# Patient Record
Sex: Female | Born: 1950 | Race: White | Hispanic: No | Marital: Married | Smoking: Never smoker
Health system: Southern US, Community
[De-identification: ages and names within clinical notes are randomized; demographics above are authoritative.]

## PROBLEM LIST (undated history)

## (undated) DIAGNOSIS — Z9889 Other specified postprocedural states: Secondary | ICD-10-CM

## (undated) DIAGNOSIS — K649 Unspecified hemorrhoids: Secondary | ICD-10-CM

## (undated) DIAGNOSIS — R112 Nausea with vomiting, unspecified: Secondary | ICD-10-CM

## (undated) DIAGNOSIS — K602 Anal fissure, unspecified: Secondary | ICD-10-CM

## (undated) DIAGNOSIS — T8859XA Other complications of anesthesia, initial encounter: Secondary | ICD-10-CM

## (undated) HISTORY — PX: RHINOPLASTY: SUR1284

## (undated) HISTORY — PX: OTHER SURGICAL HISTORY: SHX169

---

## 2000-03-18 ENCOUNTER — Encounter: Payer: Self-pay | Admitting: Gynecology

## 2000-03-18 ENCOUNTER — Encounter: Admission: RE | Admit: 2000-03-18 | Discharge: 2000-03-18 | Payer: Self-pay | Admitting: Gynecology

## 2000-05-26 ENCOUNTER — Ambulatory Visit (HOSPITAL_COMMUNITY): Admission: RE | Admit: 2000-05-26 | Discharge: 2000-05-26 | Payer: Self-pay | Admitting: Gastroenterology

## 2000-09-20 ENCOUNTER — Other Ambulatory Visit: Admission: RE | Admit: 2000-09-20 | Discharge: 2000-09-20 | Payer: Self-pay | Admitting: Gynecology

## 2001-03-23 ENCOUNTER — Encounter: Admission: RE | Admit: 2001-03-23 | Discharge: 2001-03-23 | Payer: Self-pay | Admitting: Gynecology

## 2001-03-23 ENCOUNTER — Encounter: Payer: Self-pay | Admitting: Gynecology

## 2001-10-17 ENCOUNTER — Other Ambulatory Visit: Admission: RE | Admit: 2001-10-17 | Discharge: 2001-10-17 | Payer: Self-pay | Admitting: Gynecology

## 2002-03-24 ENCOUNTER — Encounter: Admission: RE | Admit: 2002-03-24 | Discharge: 2002-03-24 | Payer: Self-pay | Admitting: Gynecology

## 2002-03-24 ENCOUNTER — Encounter: Payer: Self-pay | Admitting: Gynecology

## 2002-10-30 ENCOUNTER — Other Ambulatory Visit: Admission: RE | Admit: 2002-10-30 | Discharge: 2002-10-30 | Payer: Self-pay | Admitting: Gynecology

## 2003-04-06 ENCOUNTER — Encounter: Admission: RE | Admit: 2003-04-06 | Discharge: 2003-04-06 | Payer: Self-pay | Admitting: Gynecology

## 2003-06-13 ENCOUNTER — Ambulatory Visit (HOSPITAL_BASED_OUTPATIENT_CLINIC_OR_DEPARTMENT_OTHER): Admission: RE | Admit: 2003-06-13 | Discharge: 2003-06-13 | Payer: Self-pay | Admitting: Gynecology

## 2003-06-13 ENCOUNTER — Encounter (INDEPENDENT_AMBULATORY_CARE_PROVIDER_SITE_OTHER): Payer: Self-pay | Admitting: Specialist

## 2003-06-13 ENCOUNTER — Ambulatory Visit (HOSPITAL_COMMUNITY): Admission: RE | Admit: 2003-06-13 | Discharge: 2003-06-13 | Payer: Self-pay | Admitting: Gynecology

## 2003-10-31 ENCOUNTER — Other Ambulatory Visit: Admission: RE | Admit: 2003-10-31 | Discharge: 2003-10-31 | Payer: Self-pay | Admitting: Gynecology

## 2004-05-01 ENCOUNTER — Encounter: Admission: RE | Admit: 2004-05-01 | Discharge: 2004-05-01 | Payer: Self-pay | Admitting: Gynecology

## 2004-11-04 ENCOUNTER — Other Ambulatory Visit: Admission: RE | Admit: 2004-11-04 | Discharge: 2004-11-04 | Payer: Self-pay | Admitting: Gynecology

## 2005-05-28 ENCOUNTER — Encounter: Admission: RE | Admit: 2005-05-28 | Discharge: 2005-05-28 | Payer: Self-pay | Admitting: Gynecology

## 2005-06-08 ENCOUNTER — Encounter: Admission: RE | Admit: 2005-06-08 | Discharge: 2005-06-08 | Payer: Self-pay | Admitting: Gynecology

## 2005-11-16 ENCOUNTER — Other Ambulatory Visit: Admission: RE | Admit: 2005-11-16 | Discharge: 2005-11-16 | Payer: Self-pay | Admitting: Gynecology

## 2005-11-18 ENCOUNTER — Encounter: Admission: RE | Admit: 2005-11-18 | Discharge: 2005-11-18 | Payer: Self-pay | Admitting: Gynecology

## 2006-05-31 ENCOUNTER — Encounter: Admission: RE | Admit: 2006-05-31 | Discharge: 2006-05-31 | Payer: Self-pay | Admitting: Gynecology

## 2006-12-13 ENCOUNTER — Other Ambulatory Visit: Admission: RE | Admit: 2006-12-13 | Discharge: 2006-12-13 | Payer: Self-pay | Admitting: Gynecology

## 2007-06-03 ENCOUNTER — Encounter: Admission: RE | Admit: 2007-06-03 | Discharge: 2007-06-03 | Payer: Self-pay | Admitting: Gynecology

## 2007-09-27 ENCOUNTER — Emergency Department (HOSPITAL_COMMUNITY): Admission: EM | Admit: 2007-09-27 | Discharge: 2007-09-27 | Payer: Self-pay | Admitting: Emergency Medicine

## 2008-06-05 ENCOUNTER — Encounter: Admission: RE | Admit: 2008-06-05 | Discharge: 2008-06-05 | Payer: Self-pay | Admitting: Gynecology
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
## 2009-06-06 ENCOUNTER — Encounter: Admission: RE | Admit: 2009-06-06 | Discharge: 2009-06-06 | Payer: Self-pay | Admitting: Gynecology

## 2010-05-31 ENCOUNTER — Encounter: Payer: Self-pay | Admitting: Gynecology

## 2010-06-01 ENCOUNTER — Encounter: Payer: Self-pay | Admitting: Gynecology

## 2010-06-09 ENCOUNTER — Encounter
Admission: RE | Admit: 2010-06-09 | Discharge: 2010-06-09 | Payer: Self-pay | Source: Home / Self Care | Attending: Gynecology | Admitting: Gynecology

## 2010-09-26 NOTE — Op Note (Signed)
NAME:  Leah Edwards, Leah Edwards                               ACCOUNT NO.:  [id number]   MEDICAL RECORD NO.:  [id number]                   PATIENT TYPE:  AMB   LOCATION:  [facility]                                 FACILITY:  [facility]   PHYSICIAN:  Gretta Cool, M.D.              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   PROCEDURE:  Hysteroscopy resection of submucous fibroid and total  endometrial resection for ablation plus vapotrode.   SURGEON:  Gretta Cool, M.D.   ANESTHESIA:  MAC and paracervical block.   DESCRIPTION OF PROCEDURE:  Under excellent IV sedation as above with  paracervical block the cervix was progressively dilated with a series of  Pratt dilators to accommodate 7 mm resectoscope.  Careful examination of the  cavity confirmed a large but subtle posterior uterine wall leiomyoma in the  mid-fundal wall.  It occupied at least two-thirds of the posterior wall of  the uterus.  The fibroid was unroofed of endometrium and then progressively  resected by resection and by pulling the fibroid with the resectoscope loop  up out of the bed of the capsule.  The fibroid was thus progressively  resected estimated size at least 2 cm AP diameter and more transverse  diameter.  At this point once the fibroid was totally resected out of the  capsule,  bleeding was well controlled, the entire endometrial cavity was  then resected down to myometrium into the myometrium at least 3 mm.  At this  point the entire cavity was treated by vapotrode so as to eliminate any  islands of residual endometrial tissue.  The cornual areas were treated by  touch technique.  At this point the procedure was terminated without  complication, there was no  significant bleeding at reduced pressure and a  deficit estimated at 150 mL.  At the end of the procedure sponge and lap  counts were correct, there were no complications, and patient returned to  recovery room in excellent condition.                                               Gretta Cool, M.D.    CWL/MEDQ  D:  06/13/2003  T:  06/13/2003  Job:  [id number]   cc:   Molly Maduro A. Nicholos Johns, M.D.  [address]  [place]  [place] [address]  Fax: [phone]

## 2010-09-26 NOTE — Procedures (Signed)
[facility]  Patient:    Leah Edwards, Leah Edwards                      MRN: [id number] Proc. Date: 05/26/00 Adm. Date:  [id number] Attending:  Orland Mustard CC:         Elana Alm. Eliezer Lofts., M.D.  Gretta Cool, M.D.   Procedure Report  DATE OF BIRTH:  1951/03/13  PROCEDURE:  Colonoscopy.  MEDICATIONS:  Fentanyl 62.5 mcg, Versed 6 mg IV.  SCOPE:  Olympus pediatric video colonoscope.  INDICATIONS:  Hemoccult positive stool in a 60 year old.  DESCRIPTION OF PROCEDURE:  The procedure had been explained to the patient and consent obtained.  With the patient in the left lateral decubitus, the adult Olympus pediatric video colonoscope was inserted and advanced under direct visualization.  The prep was excellent.  We were able to advance to the cecum using abdominal pressure and position changes.  The right lower quadrant was transilluminated, and the ileocecal valve and the appendiceal orifice were seen.  The scope was withdrawn.  The cecum and ascending colon, hepatic flexure, transverse colon, splenic flexure, descending and sigmoid colon seen well upon removal.  No polyps or other lesions seen.  Internal hemorrhoids seen in the rectum upon removal of the scope.  The scope was withdrawn, and the patient tolerated the procedure well and was maintained on low-flow oxygen and pulse oximeter throughout the procedure with no obvious problem.  ASSESSMENT: 1. Hemoccult positive stool. 2. Internal hemorrhoids, possibly the source of her stool.  PLAN: 1. We will follow up in the office in 2-3 months and recheck her stool. 2. Will give hemorrhoid instruction sheet. DD:  05/26/00 TD:  05/26/00 Job: [id number]

## 2011-02-02 ENCOUNTER — Other Ambulatory Visit: Payer: Self-pay | Admitting: Gynecology

## 2011-07-09 ENCOUNTER — Other Ambulatory Visit: Payer: Self-pay | Admitting: Gynecology

## 2011-07-09 DIAGNOSIS — Z1231 Encounter for screening mammogram for malignant neoplasm of breast: Secondary | ICD-10-CM

## 2011-07-16 ENCOUNTER — Ambulatory Visit: Payer: Self-pay

## 2011-07-17 ENCOUNTER — Ambulatory Visit
Admission: RE | Admit: 2011-07-17 | Discharge: 2011-07-17 | Disposition: A | Discharge status: Home / Self Care | Payer: 59 | Source: Ambulatory Visit | Attending: Gynecology | Admitting: Gynecology

## 2011-07-17 DIAGNOSIS — Z1231 Encounter for screening mammogram for malignant neoplasm of breast: Secondary | ICD-10-CM

## 2012-02-11 ENCOUNTER — Other Ambulatory Visit: Payer: Self-pay | Admitting: Gynecology

## 2012-07-12 ENCOUNTER — Other Ambulatory Visit: Payer: Self-pay

## 2012-07-12 DIAGNOSIS — Z1231 Encounter for screening mammogram for malignant neoplasm of breast: Secondary | ICD-10-CM

## 2012-08-23 ENCOUNTER — Ambulatory Visit: Payer: 59

## 2012-08-24 ENCOUNTER — Telehealth (INDEPENDENT_AMBULATORY_CARE_PROVIDER_SITE_OTHER): Payer: Self-pay

## 2012-08-24 NOTE — Telephone Encounter (Signed)
Called the pt to schedule appt to see Dr. Donell Beers for her bleeding hemorrhoids.  She is out of town, but will call back next week to schedule.  Dr. Dahlia Client office has faxed a copy of her colonoscopy for Dr. Donell Beers to review.

## 2012-10-07 ENCOUNTER — Encounter (INDEPENDENT_AMBULATORY_CARE_PROVIDER_SITE_OTHER): Payer: Self-pay

## 2012-10-21 ENCOUNTER — Ambulatory Visit
Admission: RE | Admit: 2012-10-21 | Discharge: 2012-10-21 | Disposition: A | Discharge status: Home / Self Care | Payer: 59 | Source: Ambulatory Visit

## 2012-10-21 DIAGNOSIS — Z1231 Encounter for screening mammogram for malignant neoplasm of breast: Secondary | ICD-10-CM

## 2012-12-02 ENCOUNTER — Ambulatory Visit (INDEPENDENT_AMBULATORY_CARE_PROVIDER_SITE_OTHER): Payer: 59 | Admitting: General Surgery

## 2012-12-02 ENCOUNTER — Encounter (INDEPENDENT_AMBULATORY_CARE_PROVIDER_SITE_OTHER): Payer: Self-pay | Admitting: General Surgery

## 2012-12-02 VITALS — BP 114/62 | HR 60 | Temp 97.7°F | Resp 16 | Ht 66.0 in | Wt 136.6 lb

## 2012-12-02 DIAGNOSIS — K602 Anal fissure, unspecified: Secondary | ICD-10-CM | POA: Insufficient documentation

## 2012-12-02 DIAGNOSIS — K648 Other hemorrhoids: Secondary | ICD-10-CM

## 2012-12-02 DIAGNOSIS — K644 Residual hemorrhoidal skin tags: Secondary | ICD-10-CM | POA: Insufficient documentation

## 2012-12-02 MED ORDER — DOCUSATE SODIUM 100 MG PO CAPS: 100.0000 mg | ORAL_CAPSULE | Freq: Two times a day (BID) | ORAL | Status: AC

## 2012-12-02 MED ORDER — PRAMOXINE HCL 1 % RE FOAM: RECTAL | Status: DC | PRN

## 2012-12-02 MED ORDER — POLYETHYLENE GLYCOL 3350 17 GM/SCOOP PO POWD: 17.0000 g | Freq: Two times a day (BID) | ORAL | Status: DC

## 2012-12-02 NOTE — Progress Notes (Signed)
Subjective:     Patient ID: Leah Edwards, female   DOB: 1950/12/08, 62 y.o.   MRN: [id number]  HPI Patient is a 62 year old female that I saw several years ago for hemorrhoids.  She had been doing significantly better until the last few months. She had a repeat colonoscopy in the spring. After that she had significant pain.  She got very constipated and had difficulty relieving. She took multiple over-the-counter laxatives and had no results. She tried Citrucel which caused her to have significant cramping. She has bleeding every time she has a bowel movement. The severe pain has resolved. She never felt a hard knot on her anus. She continues to have small hemorrhoids that she can feel externally. She has also tried sitz baths but felt that they did not help significantly.  Previously, she had good results with Proctofoam and with banding.  Review of Systems  Gastrointestinal: Positive for constipation, anal bleeding and rectal pain.       Objective:   Physical Exam  Constitutional: She is oriented to person, place, and time. She appears well-developed and well-nourished. No distress.  HENT:  Head: Normocephalic and atraumatic.  Eyes: Pupils are equal, round, and reactive to light. No scleral icterus.  Cardiovascular: Normal rate.   Pulmonary/Chest: Effort normal. No respiratory distress.  Abdominal: Soft.  Genitourinary: Rectal exam shows external hemorrhoid, internal hemorrhoid and fissure.  Anterior external hemorrhoids, small, non thrombosed. Healing fissure left lateral  Flat hemorrhoids injected, anterior right, lateral left, and posterior midline.    Neurological: She is alert and oriented to person, place, and time. Coordination normal.  Skin: Skin is warm and dry. No rash noted. She is not diaphoretic. No erythema. No pallor.  Psychiatric: She has a normal mood and affect. Her behavior is normal. Judgment and thought content normal.       Assessment/Plan:     Anal  fissure Appears nearly completely healed.    Would hold on diltiazem cream for now.    Internal and external bleeding hemorrhoids Aggressively treat constipation.  Add miralax and colace.  Reviewed importance of being aggressive. Sitz baths.  followup in 8 weeks or as needed

## 2012-12-02 NOTE — Assessment & Plan Note (Signed)
Appears nearly completely healed.    Would hold on diltiazem cream for now.

## 2012-12-02 NOTE — Assessment & Plan Note (Signed)
Aggressively treat constipation.  Add miralax and colace.  Reviewed importance of being aggressive. Sitz baths.

## 2012-12-02 NOTE — Patient Instructions (Signed)
Take miralax 17 gm twice a day.  Add stool softener (colace 100 mg) twice a day.  Continue fiber intake, avoidance of long periods of time on the toilet.    Continue high water intake.

## 2014-01-23 ENCOUNTER — Other Ambulatory Visit: Payer: Self-pay

## 2014-01-23 DIAGNOSIS — Z1231 Encounter for screening mammogram for malignant neoplasm of breast: Secondary | ICD-10-CM

## 2014-02-01 ENCOUNTER — Ambulatory Visit
Admission: RE | Admit: 2014-02-01 | Discharge: 2014-02-01 | Disposition: A | Discharge status: Home / Self Care | Payer: 59 | Source: Ambulatory Visit

## 2014-02-01 DIAGNOSIS — Z1231 Encounter for screening mammogram for malignant neoplasm of breast: Secondary | ICD-10-CM

## 2015-06-28 ENCOUNTER — Other Ambulatory Visit: Payer: Self-pay | Admitting: Physician Assistant

## 2015-06-28 ENCOUNTER — Ambulatory Visit
Admission: RE | Admit: 2015-06-28 | Discharge: 2015-06-28 | Disposition: A | Discharge status: Home / Self Care | Payer: PRIVATE HEALTH INSURANCE | Source: Ambulatory Visit | Attending: Physician Assistant | Admitting: Physician Assistant

## 2015-06-28 DIAGNOSIS — M79671 Pain in right foot: Secondary | ICD-10-CM

## 2017-05-11 HISTORY — PX: OTHER SURGICAL HISTORY: SHX169

## 2018-03-08 ENCOUNTER — Other Ambulatory Visit: Payer: Self-pay | Admitting: Plastic Surgery

## 2018-05-23 DIAGNOSIS — L219 Seborrheic dermatitis, unspecified: Secondary | ICD-10-CM | POA: Diagnosis not present

## 2018-09-21 DIAGNOSIS — Z79890 Hormone replacement therapy (postmenopausal): Secondary | ICD-10-CM | POA: Diagnosis not present

## 2018-09-21 DIAGNOSIS — Z780 Asymptomatic menopausal state: Secondary | ICD-10-CM | POA: Diagnosis not present

## 2018-09-21 DIAGNOSIS — Z01419 Encounter for gynecological examination (general) (routine) without abnormal findings: Secondary | ICD-10-CM | POA: Diagnosis not present

## 2018-10-31 DIAGNOSIS — R69 Illness, unspecified: Secondary | ICD-10-CM | POA: Diagnosis not present

## 2018-11-04 DIAGNOSIS — Z780 Asymptomatic menopausal state: Secondary | ICD-10-CM | POA: Diagnosis not present

## 2018-11-04 DIAGNOSIS — Z79890 Hormone replacement therapy (postmenopausal): Secondary | ICD-10-CM | POA: Diagnosis not present

## 2018-11-04 DIAGNOSIS — Z01419 Encounter for gynecological examination (general) (routine) without abnormal findings: Secondary | ICD-10-CM | POA: Diagnosis not present

## 2018-11-04 DIAGNOSIS — Z0000 Encounter for general adult medical examination without abnormal findings: Secondary | ICD-10-CM | POA: Diagnosis not present

## 2018-11-14 DIAGNOSIS — R946 Abnormal results of thyroid function studies: Secondary | ICD-10-CM | POA: Diagnosis not present

## 2018-11-14 DIAGNOSIS — M7989 Other specified soft tissue disorders: Secondary | ICD-10-CM | POA: Diagnosis not present

## 2018-11-21 DIAGNOSIS — R946 Abnormal results of thyroid function studies: Secondary | ICD-10-CM | POA: Diagnosis not present

## 2019-01-02 DIAGNOSIS — R946 Abnormal results of thyroid function studies: Secondary | ICD-10-CM | POA: Diagnosis not present

## 2019-02-10 DIAGNOSIS — R69 Illness, unspecified: Secondary | ICD-10-CM | POA: Diagnosis not present

## 2019-02-13 DIAGNOSIS — Z20828 Contact with and (suspected) exposure to other viral communicable diseases: Secondary | ICD-10-CM | POA: Diagnosis not present

## 2019-03-21 DIAGNOSIS — Z1231 Encounter for screening mammogram for malignant neoplasm of breast: Secondary | ICD-10-CM | POA: Diagnosis not present

## 2019-03-28 DIAGNOSIS — R519 Headache, unspecified: Secondary | ICD-10-CM | POA: Diagnosis not present

## 2019-06-01 ENCOUNTER — Ambulatory Visit: Payer: Medicare HMO

## 2019-06-10 ENCOUNTER — Ambulatory Visit: Payer: Medicare HMO

## 2019-06-17 ENCOUNTER — Ambulatory Visit: Payer: Medicare HMO

## 2019-07-01 ENCOUNTER — Ambulatory Visit: Payer: Medicare HMO

## 2019-07-06 ENCOUNTER — Ambulatory Visit: Payer: PRIVATE HEALTH INSURANCE

## 2019-07-06 ENCOUNTER — Ambulatory Visit: Payer: Medicare HMO | Attending: Internal Medicine

## 2019-07-06 DIAGNOSIS — Z23 Encounter for immunization: Secondary | ICD-10-CM

## 2019-07-06 NOTE — Progress Notes (Signed)
   Covid-19 Vaccination Clinic  Name:  Leah Edwards    MRN: [id number] DOB: [birth year]  07/06/2019  [name] was observed post Covid-19 immunization for 15 minutes without incidence. She was provided with Vaccine Information Sheet and instruction to access the V-Safe system.   [name] was instructed to call 911 with any severe reactions post vaccine: Marland Kitchen Difficulty breathing  . Swelling of your face and throat  . A fast heartbeat  . A bad rash all over your body  . Dizziness and weakness    Immunizations Administered    Name Date Dose VIS Date Route   Pfizer COVID-19 Vaccine 07/06/2019  1:52 PM 0.3 mL 04/21/2019 Intramuscular   Manufacturer: [place]   Lot: [id number]   [place]: [id number]

## 2019-07-27 ENCOUNTER — Ambulatory Visit: Payer: Medicare HMO | Attending: Internal Medicine

## 2019-07-27 DIAGNOSIS — Z23 Encounter for immunization: Secondary | ICD-10-CM

## 2019-07-27 NOTE — Progress Notes (Signed)
   Covid-19 Vaccination Clinic  Name:  Leah Edwards    MRN: [id number] DOB: 05/28/1950  07/27/2019  Leah Edwards was observed post Covid-19 immunization for 15 minutes without incident. She was provided with Vaccine Information Sheet and instruction to access the V-Safe system.   Leah Edwards was instructed to call 911 with any severe reactions post vaccine: Marland Kitchen Difficulty breathing  . Swelling of face and throat  . A fast heartbeat  . A bad rash all over body  . Dizziness and weakness   Immunizations Administered    Name Date Dose VIS Date Route   Pfizer COVID-19 Vaccine 07/27/2019 11:06 AM 0.3 mL 04/21/2019 Intramuscular   Manufacturer: [place]   Lot: [id number]   [place]: [id number]

## 2019-10-19 DIAGNOSIS — Z01419 Encounter for gynecological examination (general) (routine) without abnormal findings: Secondary | ICD-10-CM | POA: Diagnosis not present

## 2019-10-19 DIAGNOSIS — Z79890 Hormone replacement therapy (postmenopausal): Secondary | ICD-10-CM | POA: Diagnosis not present

## 2019-10-19 DIAGNOSIS — Z780 Asymptomatic menopausal state: Secondary | ICD-10-CM | POA: Diagnosis not present

## 2020-01-03 DIAGNOSIS — K602 Anal fissure, unspecified: Secondary | ICD-10-CM | POA: Diagnosis not present

## 2020-01-23 DIAGNOSIS — L821 Other seborrheic keratosis: Secondary | ICD-10-CM | POA: Diagnosis not present

## 2020-01-23 DIAGNOSIS — Z1283 Encounter for screening for malignant neoplasm of skin: Secondary | ICD-10-CM | POA: Diagnosis not present

## 2020-01-23 DIAGNOSIS — D225 Melanocytic nevi of trunk: Secondary | ICD-10-CM | POA: Diagnosis not present

## 2020-02-24 DIAGNOSIS — R69 Illness, unspecified: Secondary | ICD-10-CM | POA: Diagnosis not present

## 2020-03-05 DIAGNOSIS — K602 Anal fissure, unspecified: Secondary | ICD-10-CM | POA: Diagnosis not present

## 2020-04-18 DIAGNOSIS — Z0100 Encounter for examination of eyes and vision without abnormal findings: Secondary | ICD-10-CM | POA: Diagnosis not present

## 2020-04-18 DIAGNOSIS — H5213 Myopia, bilateral: Secondary | ICD-10-CM | POA: Diagnosis not present

## 2020-06-05 DIAGNOSIS — Z20822 Contact with and (suspected) exposure to covid-19: Secondary | ICD-10-CM | POA: Diagnosis not present

## 2020-06-05 DIAGNOSIS — Z03818 Encounter for observation for suspected exposure to other biological agents ruled out: Secondary | ICD-10-CM | POA: Diagnosis not present

## 2020-07-22 DIAGNOSIS — Z809 Family history of malignant neoplasm, unspecified: Secondary | ICD-10-CM | POA: Diagnosis not present

## 2020-07-22 DIAGNOSIS — Z79890 Hormone replacement therapy (postmenopausal): Secondary | ICD-10-CM | POA: Diagnosis not present

## 2020-10-22 DIAGNOSIS — Z01419 Encounter for gynecological examination (general) (routine) without abnormal findings: Secondary | ICD-10-CM | POA: Diagnosis not present

## 2020-10-22 DIAGNOSIS — Z780 Asymptomatic menopausal state: Secondary | ICD-10-CM | POA: Diagnosis not present

## 2020-10-22 DIAGNOSIS — Z79890 Hormone replacement therapy (postmenopausal): Secondary | ICD-10-CM | POA: Diagnosis not present

## 2020-10-22 DIAGNOSIS — Z6822 Body mass index (BMI) 22.0-22.9, adult: Secondary | ICD-10-CM | POA: Diagnosis not present

## 2020-10-22 DIAGNOSIS — Z1231 Encounter for screening mammogram for malignant neoplasm of breast: Secondary | ICD-10-CM | POA: Diagnosis not present

## 2021-01-22 DIAGNOSIS — L218 Other seborrheic dermatitis: Secondary | ICD-10-CM | POA: Diagnosis not present

## 2021-01-22 DIAGNOSIS — L821 Other seborrheic keratosis: Secondary | ICD-10-CM | POA: Diagnosis not present

## 2021-01-22 DIAGNOSIS — Z1283 Encounter for screening for malignant neoplasm of skin: Secondary | ICD-10-CM | POA: Diagnosis not present

## 2021-02-14 DIAGNOSIS — J329 Chronic sinusitis, unspecified: Secondary | ICD-10-CM | POA: Diagnosis not present

## 2021-02-15 DIAGNOSIS — J329 Chronic sinusitis, unspecified: Secondary | ICD-10-CM | POA: Diagnosis not present

## 2021-02-15 DIAGNOSIS — R21 Rash and other nonspecific skin eruption: Secondary | ICD-10-CM | POA: Diagnosis not present

## 2021-02-15 DIAGNOSIS — R051 Acute cough: Secondary | ICD-10-CM | POA: Diagnosis not present

## 2021-03-10 DIAGNOSIS — Z131 Encounter for screening for diabetes mellitus: Secondary | ICD-10-CM | POA: Diagnosis not present

## 2021-03-10 DIAGNOSIS — Z136 Encounter for screening for cardiovascular disorders: Secondary | ICD-10-CM | POA: Diagnosis not present

## 2021-03-10 DIAGNOSIS — E559 Vitamin D deficiency, unspecified: Secondary | ICD-10-CM | POA: Diagnosis not present

## 2021-03-10 DIAGNOSIS — Z0000 Encounter for general adult medical examination without abnormal findings: Secondary | ICD-10-CM | POA: Diagnosis not present

## 2021-03-10 DIAGNOSIS — Z1159 Encounter for screening for other viral diseases: Secondary | ICD-10-CM | POA: Diagnosis not present

## 2021-03-10 DIAGNOSIS — Z13220 Encounter for screening for lipoid disorders: Secondary | ICD-10-CM | POA: Diagnosis not present

## 2021-03-10 DIAGNOSIS — E2839 Other primary ovarian failure: Secondary | ICD-10-CM | POA: Diagnosis not present

## 2021-03-10 DIAGNOSIS — Z23 Encounter for immunization: Secondary | ICD-10-CM | POA: Diagnosis not present

## 2021-03-26 DIAGNOSIS — Z780 Asymptomatic menopausal state: Secondary | ICD-10-CM | POA: Diagnosis not present

## 2021-05-15 DIAGNOSIS — H5212 Myopia, left eye: Secondary | ICD-10-CM | POA: Diagnosis not present

## 2021-10-09 DIAGNOSIS — U071 COVID-19: Secondary | ICD-10-CM | POA: Diagnosis not present

## 2021-11-12 DIAGNOSIS — Z79890 Hormone replacement therapy (postmenopausal): Secondary | ICD-10-CM | POA: Diagnosis not present

## 2021-11-12 DIAGNOSIS — Z1231 Encounter for screening mammogram for malignant neoplasm of breast: Secondary | ICD-10-CM | POA: Diagnosis not present

## 2021-11-12 DIAGNOSIS — Z01419 Encounter for gynecological examination (general) (routine) without abnormal findings: Secondary | ICD-10-CM | POA: Diagnosis not present

## 2022-03-27 DIAGNOSIS — Z0000 Encounter for general adult medical examination without abnormal findings: Secondary | ICD-10-CM | POA: Diagnosis not present

## 2022-03-27 DIAGNOSIS — Z86010 Personal history of colonic polyps: Secondary | ICD-10-CM | POA: Diagnosis not present

## 2022-03-27 DIAGNOSIS — K602 Anal fissure, unspecified: Secondary | ICD-10-CM | POA: Diagnosis not present

## 2022-03-27 DIAGNOSIS — E559 Vitamin D deficiency, unspecified: Secondary | ICD-10-CM | POA: Diagnosis not present

## 2022-03-27 DIAGNOSIS — N393 Stress incontinence (female) (male): Secondary | ICD-10-CM | POA: Diagnosis not present

## 2022-03-27 DIAGNOSIS — Z23 Encounter for immunization: Secondary | ICD-10-CM | POA: Diagnosis not present

## 2022-03-27 DIAGNOSIS — K5909 Other constipation: Secondary | ICD-10-CM | POA: Diagnosis not present

## 2022-03-27 DIAGNOSIS — K648 Other hemorrhoids: Secondary | ICD-10-CM | POA: Diagnosis not present

## 2022-03-27 DIAGNOSIS — J309 Allergic rhinitis, unspecified: Secondary | ICD-10-CM | POA: Diagnosis not present

## 2022-04-11 DIAGNOSIS — Z823 Family history of stroke: Secondary | ICD-10-CM | POA: Diagnosis not present

## 2022-04-11 DIAGNOSIS — Z809 Family history of malignant neoplasm, unspecified: Secondary | ICD-10-CM | POA: Diagnosis not present

## 2022-04-11 DIAGNOSIS — Z79890 Hormone replacement therapy (postmenopausal): Secondary | ICD-10-CM | POA: Diagnosis not present

## 2022-04-28 DIAGNOSIS — K602 Anal fissure, unspecified: Secondary | ICD-10-CM | POA: Diagnosis not present

## 2022-05-12 DIAGNOSIS — R69 Illness, unspecified: Secondary | ICD-10-CM | POA: Diagnosis not present

## 2022-07-11 DIAGNOSIS — R69 Illness, unspecified: Secondary | ICD-10-CM | POA: Diagnosis not present

## 2022-07-20 ENCOUNTER — Telehealth: Payer: Self-pay | Admitting: Gastroenterology

## 2022-07-20 NOTE — Telephone Encounter (Signed)
Good Afternoon Dr Silverio Decamp,  We have received a referral for this patient from Dr Dema Severin for a colonoscopy, anal fissure, and hemorrhoids. Patient is a former patient of Dr Oletta Lamas with [facility], unhappy with her care there and hearing good things about you she is requesting to transfer her care over to you. Records will be sent to your office, please review them at your earliest convenience and advise on scheduling.   Thank You!

## 2022-07-21 ENCOUNTER — Ambulatory Visit: Payer: Self-pay | Admitting: General Surgery

## 2022-07-21 DIAGNOSIS — K602 Anal fissure, unspecified: Secondary | ICD-10-CM | POA: Diagnosis not present

## 2022-07-21 NOTE — H&P (Signed)
PROVIDER:  Monico Blitz, MD  MRN: [id number] DOB: 04/22/1951 DATE OF ENCOUNTER: 07/21/2022 Subjective   Chief Complaint: No chief complaint on file.     History of Present Illness: Leah Edwards is a 72 y.o. 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  I last saw her in the office in Oct 2021.  She had presented at that time with pain.  On examination she was noted to have an anal fissure.  She was placed on diltiazem ointment and had significant improvement in her symptoms.  She continues to have intermittent symptoms.  She reports irregular bowel habits as well.  She has intermittent bleeding.  She is due for colonoscopy in May 2024.  She has been referred to Dr. Silverio Decamp for this.  We decided to try a regular fiber supplement to see if this would help with her irregular bowel movements.  I also placed her on diltiazem ointment for an anal fissure.  She continues to have issues with pain with bowel movements.  No past medical history on file. Past Surgical History:  Procedure Laterality Date   ovarian cyst     RHINOPLASTY      Social History   Socioeconomic History   Marital status: Married    Spouse name: Not on file   Number of children: Not on file   Years of education: Not on file   Highest education level: Not on file  Occupational History   Not on file  Tobacco Use   Smoking status: Never   Smokeless tobacco: Never  Substance and Sexual Activity   Alcohol use: Yes    Comment: 1 drink per week   Drug use: No   Sexual activity: Not on file  Other Topics Concern   Not on file  Social History Narrative   Not on file   Social Determinants of Health   Financial Resource Strain: Not on file  Food Insecurity: Not on file  Transportation Needs: Not on file  Physical Activity: Not on file  Stress: Not on file  Social Connections: Not on file  Intimate Partner Violence: Not on file   Family History  Problem Relation Age of Onset   Cancer Father         prostate    Current Outpatient Medications:    docusate sodium (COLACE) 100 MG capsule, Take 1 capsule (100 mg total) by mouth 2 (two) times daily., Disp: 10 capsule, Rfl: 0   MINIVELLE 0.0375 MG/24HR, , Disp: , Rfl:    polyethylene glycol powder (MIRALAX) powder, Take 17 g by mouth 2 (two) times daily., Disp: 255 g, Rfl: 0   pramoxine (PROCTOFOAM) 1 % foam, Place rectally every 4 (four) hours as needed for hemorrhoids., Disp: 15 g, Rfl: 2   progesterone (PROMETRIUM) 200 MG capsule, , Disp: , Rfl:    ranitidine (ZANTAC) 75 MG tablet, Take 75 mg by mouth 2 (two) times daily., Disp: , Rfl:   No Known Allergies   Review of Systems - Negative except as stated above    Objective:    General appearance - alert, well appearing, and in no distress Rectal - normal rectal, no masses, right posterior fissure, no sphincter hypertension     Assessment and Plan:  There are no diagnoses linked to this encounter.   72 year old female who presented to the office for acute anal pain.  On exam today she has a right posterior anal fissure.  She did not have a good response to the  diltiazem ointment.  On examination today she appears to have more of a traction injury due to her right posterior hemorrhoid.  There is chronic inflammatory tissue nearby that seems to be prohibiting wound healing.  I have recommended hemorrhoidectomy of her right posterior hemorrhoid column.  If she has any other internal disease, she would like that addressed at the same time.  We discussed the risk of surgery including pain, bleeding and recurrence.  All questions were answered.  Patient would like to proceed.      Rosario Adie, MD Colon and Rectal Surgery [facility]

## 2022-07-29 DIAGNOSIS — H35363 Drusen (degenerative) of macula, bilateral: Secondary | ICD-10-CM | POA: Diagnosis not present

## 2022-08-20 ENCOUNTER — Encounter: Payer: Self-pay | Admitting: Podiatry

## 2022-08-20 ENCOUNTER — Ambulatory Visit (INDEPENDENT_AMBULATORY_CARE_PROVIDER_SITE_OTHER): Payer: Medicare HMO

## 2022-08-20 ENCOUNTER — Ambulatory Visit: Payer: Medicare HMO | Admitting: Podiatry

## 2022-08-20 DIAGNOSIS — M21619 Bunion of unspecified foot: Secondary | ICD-10-CM | POA: Diagnosis not present

## 2022-08-20 DIAGNOSIS — M722 Plantar fascial fibromatosis: Secondary | ICD-10-CM | POA: Diagnosis not present

## 2022-08-20 DIAGNOSIS — M79671 Pain in right foot: Secondary | ICD-10-CM | POA: Diagnosis not present

## 2022-08-20 MED ORDER — TRIAMCINOLONE ACETONIDE 10 MG/ML IJ SUSP
10.0000 mg | Freq: Once | INTRAMUSCULAR | Status: AC
  Administered 2022-08-20: 10 mg

## 2022-08-20 NOTE — Patient Instructions (Signed)

## 2022-08-20 NOTE — Progress Notes (Signed)
Subjective:   Patient ID: Leah Edwards, female   DOB: 72 y.o.   MRN: [id number]   HPI Patient presents stating she has had a lot of pain in her right heel of approximate 5 months duration she is very active likes to play pickle ball and tennis but has not been able to.  Also does have bunion deformity bilateral with discomfort on a continuing basis.  Patient does not smoke likes to be active   Review of Systems  All other systems reviewed and are negative.       Objective:  Physical Exam Vitals and nursing note reviewed.  Constitutional:      Appearance: She is well-developed.  Pulmonary:     Effort: Pulmonary effort is normal.  Musculoskeletal:        General: Normal range of motion.  Skin:    General: Skin is warm.  Neurological:     Mental Status: She is alert.     Neurovascular status intact muscle strength found to be adequate range of motion adequate with patient found to have discomfort of an acute nature right plantar fascia at the insertional point tendon calcaneus moderate depression of the arch with patient having had a history of good feet inserts which were not effective.  Good digital perfusion well-oriented x 3 moderate bunion deformity bilateral     Assessment:  Acute Planter fasciitis right structural bunion deformity bilateral     Plan:  H&P reviewed all conditions and for the right we will get a focus on the heel and bunions were just discussed in a casual nature.  Sterile prep injected the fascia right 3 mg Kenalog 5 mg Xylocaine applied fascial brace to lift up the arch gave instructions on stretch physical therapy reappoint to recheck  X-rays indicate spur no indication stress fracture arthritis moderate structural bunion deformity noted

## 2022-08-21 NOTE — Telephone Encounter (Signed)
Patient following up on transfer

## 2022-08-21 NOTE — Telephone Encounter (Signed)
Ok to schedule next available appt for direct colonoscopy. Thanks

## 2022-08-25 ENCOUNTER — Encounter (HOSPITAL_BASED_OUTPATIENT_CLINIC_OR_DEPARTMENT_OTHER): Payer: Self-pay | Admitting: General Surgery

## 2022-08-25 ENCOUNTER — Other Ambulatory Visit: Payer: Self-pay

## 2022-08-25 NOTE — Telephone Encounter (Signed)
Called pt to schedule, states she scheduled to see another provider.

## 2022-08-25 NOTE — Progress Notes (Signed)
Spoke w/ via phone for pre-op interview---pt Lab needs dos----    none           Lab results------none COVID test -----patient states asymptomatic no test needed Arrive at -------530 am 09-03-2022 NPO after MN NO Solid Food.  Clear liquids from MN until---430 am Med rec completed Medications to take morning of surgery -----estrogen patch Diabetic medication -----n/a Patient instructed no nail polish to be worn day of surgery Patient instructed to bring photo id and insurance card day of surgery Patient aware to have Driver (ride ) / caregiver   Leah Edwards husband  for 24 hours after surgery  Patient Special Instructions -----none Pre-Op special Istructions -----none Patient verbalized understanding of instructions that were given at this phone interview. Patient denies shortness of breath, chest pain, fever, cough at this phone interview.

## 2022-08-26 NOTE — Progress Notes (Signed)
Patient called in and asked if she was suppose to do any type of bowel prep before her hemorrhoidectomy with Dr. Maisie Fus on 09/03/22. I did not see any orders in for any type of bowel prep or laxative. Instructed patient to call Dr. Maisie Fus for clarification.

## 2022-08-31 DIAGNOSIS — R69 Illness, unspecified: Secondary | ICD-10-CM | POA: Diagnosis not present

## 2022-09-02 ENCOUNTER — Encounter: Payer: Self-pay | Admitting: Podiatry

## 2022-09-02 ENCOUNTER — Ambulatory Visit: Payer: Medicare HMO | Admitting: Podiatry

## 2022-09-02 DIAGNOSIS — M722 Plantar fascial fibromatosis: Secondary | ICD-10-CM

## 2022-09-02 NOTE — Anesthesia Preprocedure Evaluation (Signed)
Anesthesia Evaluation  Patient identified by MRN, date of birth, ID band Patient awake    Reviewed: Allergy & Precautions, NPO status , Patient's Chart, lab work & pertinent test results  History of Anesthesia Complications (+) PONV and history of anesthetic complications  Airway Mallampati: II  TM Distance: >3 FB Neck ROM: Full    Dental   Pulmonary neg pulmonary ROS   breath sounds clear to auscultation       Cardiovascular negative cardio ROS  Rhythm:Regular Rate:Normal     Neuro/Psych negative neurological ROS     GI/Hepatic negative GI ROS, Neg liver ROS,,,  Endo/Other  negative endocrine ROS    Renal/GU negative Renal ROS     Musculoskeletal   Abdominal   Peds  Hematology negative hematology ROS (+)   Anesthesia Other Findings   Reproductive/Obstetrics                             Anesthesia Physical Anesthesia Plan  ASA: 1  Anesthesia Plan: MAC   Post-op Pain Management: Tylenol PO (pre-op)* and Toradol IV (intra-op)*   Induction:   PONV Risk Score and Plan: 3 and Propofol infusion, Ondansetron and Treatment may vary due to age or medical condition  Airway Management Planned: Natural Airway and Nasal Cannula  Additional Equipment:   Intra-op Plan:   Post-operative Plan:   Informed Consent: I have reviewed the patients History and Physical, chart, labs and discussed the procedure including the risks, benefits and alternatives for the proposed anesthesia with the patient or authorized representative who has indicated his/her understanding and acceptance.     Dental advisory given  Plan Discussed with: CRNA  Anesthesia Plan Comments:        Anesthesia Quick Evaluation

## 2022-09-03 ENCOUNTER — Encounter (HOSPITAL_BASED_OUTPATIENT_CLINIC_OR_DEPARTMENT_OTHER): Payer: Self-pay | Admitting: General Surgery

## 2022-09-03 ENCOUNTER — Ambulatory Visit (HOSPITAL_BASED_OUTPATIENT_CLINIC_OR_DEPARTMENT_OTHER)
Admission: RE | Admit: 2022-09-03 | Discharge: 2022-09-03 | Disposition: A | Discharge status: Home / Self Care | Payer: Medicare HMO | Source: Ambulatory Visit | Attending: General Surgery | Admitting: General Surgery

## 2022-09-03 ENCOUNTER — Other Ambulatory Visit: Payer: Self-pay

## 2022-09-03 ENCOUNTER — Ambulatory Visit (HOSPITAL_BASED_OUTPATIENT_CLINIC_OR_DEPARTMENT_OTHER): Payer: Medicare HMO | Admitting: Anesthesiology

## 2022-09-03 ENCOUNTER — Encounter (HOSPITAL_BASED_OUTPATIENT_CLINIC_OR_DEPARTMENT_OTHER)
Admission: RE | Disposition: A | Discharge status: Home / Self Care | Payer: Self-pay | Source: Ambulatory Visit | Attending: General Surgery

## 2022-09-03 DIAGNOSIS — K644 Residual hemorrhoidal skin tags: Secondary | ICD-10-CM | POA: Insufficient documentation

## 2022-09-03 DIAGNOSIS — Z79899 Other long term (current) drug therapy: Secondary | ICD-10-CM | POA: Diagnosis not present

## 2022-09-03 DIAGNOSIS — K602 Anal fissure, unspecified: Secondary | ICD-10-CM | POA: Diagnosis not present

## 2022-09-03 DIAGNOSIS — K601 Chronic anal fissure: Secondary | ICD-10-CM | POA: Diagnosis not present

## 2022-09-03 DIAGNOSIS — Z01818 Encounter for other preprocedural examination: Secondary | ICD-10-CM

## 2022-09-03 HISTORY — DX: Other complications of anesthesia, initial encounter: T88.59XA

## 2022-09-03 HISTORY — DX: Anal fissure, unspecified: K60.2

## 2022-09-03 HISTORY — PX: HEMORRHOID SURGERY: SHX153

## 2022-09-03 HISTORY — DX: Other specified postprocedural states: R11.2

## 2022-09-03 HISTORY — DX: Unspecified hemorrhoids: K64.9

## 2022-09-03 HISTORY — DX: Other specified postprocedural states: Z98.890

## 2022-09-03 SURGERY — HEMORRHOIDECTOMY
Anesthesia: Monitor Anesthesia Care | Site: Rectum

## 2022-09-03 MED ORDER — FENTANYL CITRATE (PF) 100 MCG/2ML IJ SOLN
INTRAMUSCULAR | Status: DC | PRN
  Administered 2022-09-03: 25 ug via INTRAVENOUS
  Administered 2022-09-03: 50 ug via INTRAVENOUS

## 2022-09-03 MED ORDER — GABAPENTIN 300 MG PO CAPS
300.0000 mg | ORAL_CAPSULE | ORAL | Status: AC
  Administered 2022-09-03: 300 mg via ORAL

## 2022-09-03 MED ORDER — OXYCODONE HCL 5 MG/5ML PO SOLN: 5.0000 mg | Freq: Once | ORAL | Status: DC | PRN

## 2022-09-03 MED ORDER — BUPIVACAINE LIPOSOME 1.3 % IJ SUSP: 20.0000 mL | Freq: Once | INTRAMUSCULAR | Status: DC

## 2022-09-03 MED ORDER — ACETAMINOPHEN 500 MG PO TABS
1000.0000 mg | ORAL_TABLET | ORAL | Status: AC
  Administered 2022-09-03: 1000 mg via ORAL

## 2022-09-03 MED ORDER — OXYCODONE HCL 5 MG PO TABS: 5.0000 mg | ORAL_TABLET | Freq: Once | ORAL | Status: DC | PRN

## 2022-09-03 MED ORDER — EPHEDRINE 5 MG/ML INJ
INTRAVENOUS | Status: AC
  Filled 2022-09-03: qty 5

## 2022-09-03 MED ORDER — BUPIVACAINE-EPINEPHRINE (PF) 0.5% -1:200000 IJ SOLN
INTRAMUSCULAR | Status: DC | PRN
  Administered 2022-09-03: 50 mL via SURGICAL_CAVITY

## 2022-09-03 MED ORDER — ONDANSETRON HCL 4 MG/2ML IJ SOLN
INTRAMUSCULAR | Status: DC | PRN
  Administered 2022-09-03: 4 mg via INTRAVENOUS

## 2022-09-03 MED ORDER — LACTATED RINGERS IV SOLN: INTRAVENOUS | Status: DC

## 2022-09-03 MED ORDER — SODIUM CHLORIDE 0.9% FLUSH: 3.0000 mL | Freq: Two times a day (BID) | INTRAVENOUS | Status: DC

## 2022-09-03 MED ORDER — PROPOFOL 500 MG/50ML IV EMUL
INTRAVENOUS | Status: DC | PRN
  Administered 2022-09-03: 200 ug/kg/min via INTRAVENOUS

## 2022-09-03 MED ORDER — 0.9 % SODIUM CHLORIDE (POUR BTL) OPTIME
TOPICAL | Status: DC | PRN
  Administered 2022-09-03: 500 mL

## 2022-09-03 MED ORDER — OXYCODONE HCL 5 MG PO TABS: 5.0000 mg | ORAL_TABLET | ORAL | 0 refills | Status: AC | PRN

## 2022-09-03 MED ORDER — PROPOFOL 10 MG/ML IV BOLUS
INTRAVENOUS | Status: DC | PRN
  Administered 2022-09-03: 10 mg via INTRAVENOUS

## 2022-09-03 MED ORDER — MIDAZOLAM HCL 2 MG/2ML IJ SOLN
INTRAMUSCULAR | Status: AC
  Filled 2022-09-03: qty 2

## 2022-09-03 MED ORDER — PROPOFOL 500 MG/50ML IV EMUL
INTRAVENOUS | Status: AC
  Filled 2022-09-03: qty 50

## 2022-09-03 MED ORDER — GABAPENTIN 300 MG PO CAPS
ORAL_CAPSULE | ORAL | Status: AC
  Filled 2022-09-03: qty 1

## 2022-09-03 MED ORDER — FENTANYL CITRATE (PF) 100 MCG/2ML IJ SOLN: 25.0000 ug | INTRAMUSCULAR | Status: DC | PRN

## 2022-09-03 MED ORDER — ACETAMINOPHEN 500 MG PO TABS
ORAL_TABLET | ORAL | Status: AC
  Filled 2022-09-03: qty 2

## 2022-09-03 MED ORDER — MIDAZOLAM HCL 5 MG/5ML IJ SOLN
INTRAMUSCULAR | Status: DC | PRN
  Administered 2022-09-03: 2 mg via INTRAVENOUS

## 2022-09-03 MED ORDER — EPHEDRINE SULFATE-NACL 50-0.9 MG/10ML-% IV SOSY
PREFILLED_SYRINGE | INTRAVENOUS | Status: DC | PRN
  Administered 2022-09-03: 5 mg via INTRAVENOUS

## 2022-09-03 MED ORDER — FENTANYL CITRATE (PF) 100 MCG/2ML IJ SOLN
INTRAMUSCULAR | Status: AC
  Filled 2022-09-03: qty 2

## 2022-09-03 MED ORDER — AMISULPRIDE (ANTIEMETIC) 5 MG/2ML IV SOLN: 10.0000 mg | Freq: Once | INTRAVENOUS | Status: DC | PRN

## 2022-09-03 SURGICAL SUPPLY — 40 items
BLADE EXTENDED COATED 6.5IN (ELECTRODE) IMPLANT
BLADE SURG 10 STRL SS (BLADE) IMPLANT
BRIEF MESH DISP LRG (UNDERPADS AND DIAPERS) IMPLANT
COVER BACK TABLE 60X90IN (DRAPES) ×1 IMPLANT
COVER MAYO STAND STRL (DRAPES) ×1 IMPLANT
DRAPE HYSTEROSCOPY (MISCELLANEOUS) IMPLANT
DRAPE LAPAROTOMY 100X72 PEDS (DRAPES) ×1 IMPLANT
DRAPE SHEET LG 3/4 BI-LAMINATE (DRAPES) IMPLANT
DRAPE UTILITY XL STRL (DRAPES) ×1 IMPLANT
ELECT REM PT RETURN 9FT ADLT (ELECTROSURGICAL) ×1
ELECTRODE REM PT RTRN 9FT ADLT (ELECTROSURGICAL) ×1 IMPLANT
GAUZE 4X4 16PLY ~~LOC~~+RFID DBL (SPONGE) ×1 IMPLANT
GAUZE PAD ABD 8X10 STRL (GAUZE/BANDAGES/DRESSINGS) ×1 IMPLANT
GAUZE SPONGE 4X4 12PLY STRL (GAUZE/BANDAGES/DRESSINGS) IMPLANT
GLOVE BIO SURGEON STRL SZ 6.5 (GLOVE) ×1 IMPLANT
GLOVE INDICATOR 6.5 STRL GRN (GLOVE) ×1 IMPLANT
KIT SIGMOIDOSCOPE (SET/KITS/TRAYS/PACK) IMPLANT
KIT TURNOVER CYSTO (KITS) ×1 IMPLANT
LEGGING LITHOTOMY PAIR STRL (DRAPES) IMPLANT
NDL HYPO 22X1.5 SAFETY MO (MISCELLANEOUS) ×1 IMPLANT
NEEDLE HYPO 22X1.5 SAFETY MO (MISCELLANEOUS) ×1 IMPLANT
NS IRRIG 500ML POUR BTL (IV SOLUTION) ×1 IMPLANT
PACK BASIN DAY SURGERY FS (CUSTOM PROCEDURE TRAY) ×1 IMPLANT
PAD ARMBOARD 7.5X6 YLW CONV (MISCELLANEOUS) IMPLANT
PENCIL SMOKE EVACUATOR (MISCELLANEOUS) ×1 IMPLANT
SLEEVE SCD COMPRESS KNEE MED (STOCKING) ×1 IMPLANT
SPONGE HEMORRHOID 8X3CM (HEMOSTASIS) IMPLANT
SPONGE SURGIFOAM ABS GEL 100 (HEMOSTASIS) IMPLANT
SPONGE SURGIFOAM ABS GEL 12-7 (HEMOSTASIS) IMPLANT
SUT CHROMIC 2 0 SH (SUTURE) ×2 IMPLANT
SUT CHROMIC 3 0 SH 27 (SUTURE) ×2 IMPLANT
SUT VIC AB 2-0 SH 27 (SUTURE)
SUT VIC AB 2-0 SH 27XBRD (SUTURE) IMPLANT
SUT VIC AB 4-0 SH 18 (SUTURE) IMPLANT
SYR CONTROL 10ML LL (SYRINGE) ×1 IMPLANT
TOWEL OR 17X24 6PK STRL BLUE (TOWEL DISPOSABLE) ×1 IMPLANT
TRAY DSU PREP LF (CUSTOM PROCEDURE TRAY) ×1 IMPLANT
TUBE CONNECTING 12X1/4 (SUCTIONS) ×1 IMPLANT
WATER STERILE IRR 500ML POUR (IV SOLUTION) IMPLANT
YANKAUER SUCT BULB TIP NO VENT (SUCTIONS) ×1 IMPLANT

## 2022-09-03 NOTE — H&P (Signed)
PROVIDER:  Elenora Gamma, MD   DOB: June 21, 1950  Subjective        History of Present Illness: Leah Edwards is a 72 y.o. 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  I last saw her in the office in Oct 2021.  She had presented at that time with pain.  On examination she was noted to have an anal fissure.  She was placed on diltiazem ointment and had significant improvement in her symptoms.  She continues to have intermittent symptoms.  She reports irregular bowel habits as well.  She has intermittent bleeding.  She is due for colonoscopy in May 2024.  She has been referred to Dr. Lavon Paganini for this.  We decided to try a regular fiber supplement to see if this would help with her irregular bowel movements.  I also placed her on diltiazem ointment for an anal fissure.  She continues to have issues with pain with bowel movements.   No past medical history on file.      Past Surgical History:  Procedure Laterality Date   ovarian cyst       RHINOPLASTY          Social History         Socioeconomic History   Marital status: Married      Spouse name: Not on file   Number of children: Not on file   Years of education: Not on file   Highest education level: Not on file  Occupational History   Not on file  Tobacco Use   Smoking status: Never   Smokeless tobacco: Never  Substance and Sexual Activity   Alcohol use: Yes      Comment: 1 drink per week   Drug use: No   Sexual activity: Not on file  Other Topics Concern   Not on file  Social History Narrative   Not on file    Social Determinants of Health    Financial Resource Strain: Not on file  Food Insecurity: Not on file  Transportation Needs: Not on file  Physical Activity: Not on file  Stress: Not on file  Social Connections: Not on file  Intimate Partner Violence: Not on file         Family History  Problem Relation Age of Onset   Cancer Father          prostate      Current Outpatient  Medications:    docusate sodium (COLACE) 100 MG capsule, Take 1 capsule (100 mg total) by mouth 2 (two) times daily., Disp: 10 capsule, Rfl: 0   MINIVELLE 0.0375 MG/24HR, , Disp: , Rfl:    polyethylene glycol powder (MIRALAX) powder, Take 17 g by mouth 2 (two) times daily., Disp: 255 g, Rfl: 0   pramoxine (PROCTOFOAM) 1 % foam, Place rectally every 4 (four) hours as needed for hemorrhoids., Disp: 15 g, Rfl: 2   progesterone (PROMETRIUM) 200 MG capsule, , Disp: , Rfl:    ranitidine (ZANTAC) 75 MG tablet, Take 75 mg by mouth 2 (two) times daily., Disp: , Rfl:    No Known Allergies     Review of Systems - Negative except as stated above     Objective:      Vitals:   09/03/22 0559  BP: (!) 156/77  Pulse: (!) 59  Resp: 17  Temp: 98.2 F (36.8 C)  SpO2: 98%    General appearance - alert, well appearing, and in no distress CV: RRR  Lungs: CTA Abd: soft Rectal - normal rectal, no masses, right posterior fissure, no sphincter hypertension         Assessment and Plan:     72 year old female who presented to the office for acute anal pain.  On exam today she has a right posterior anal fissure.  She did not have a good response to the diltiazem ointment.  On examination today she appears to have more of a traction injury due to her right posterior hemorrhoid.  There is chronic inflammatory tissue nearby that seems to be prohibiting wound healing.  I have recommended hemorrhoidectomy of her right posterior hemorrhoid column.  If she has any other internal disease, she would like that addressed at the same time.  We discussed the risk of surgery including pain, bleeding and recurrence.  All questions were answered.  Patient would like to proceed.           Vanita Panda, MD Colon and Rectal Surgery [facility]

## 2022-09-03 NOTE — Op Note (Signed)
09/03/2022  8:20 AM  PATIENT:  Leah Edwards  72 y.o. 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  INDICATION: 72 y.o. 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  MAC anesthesia was induced without difficulty. The patient was then positioned in prone jackknife position with buttocks gently taped apart.  The patient was then prepped and draped in usual sterile fashion.  SCDs were noted to be in place prior to the initiation of anesthesia. A surgical timeout was performed indicating the correct patient, procedure, positioning and need for preoperative antibiotics.  A rectal block was performed using Marcaine with epinephrine mixed with Experel.    I began with a digital rectal exam.  There was no sphincter hypertension.  There were no masses noted.  There was decreased rectal tone.   I then placed a Hill-Ferguson anoscope into the anal canal and evaluated this completely.  The patient appeared to have a large right lateral anal fissure traversing her entire anal canal beside her right posterior hemorrhoid column.  The entire hemorrhoid column and fissure was excised using Metzenbaum scissors.  The proximal  portion of this incision was closed using a running 2-0 chromic suture.  The portion distal to the dentate line was closed using a running 3-0 chromic suture.  I then excised the fissure on the left lateral side using Metzenbaum scissors and closed this using interrupted 2-0 chromic sutures.  Additional Marcaine was placed around both incisions.  The patient was then awakened from anesthesia and sent to the postanesthesia care unit in stable condition.  All counts were correct per operating room staff.  Vanita Panda, MD  Colorectal and General Surgery [facility]

## 2022-09-03 NOTE — Progress Notes (Signed)
Subjective:   Patient ID: Leah Edwards, female   DOB: 72 y.o.   MRN: [id number]   HPI Patient states I am improved but still having a lot of pain when I get up in the morning and after periods of sitting   ROS      Objective:  Physical Exam  Neurovascular status was found to be intact with continued exquisite discomfort plantar aspect right heel at the insertional point of the tendon into the calcaneus with fluid buildup noted and a relative thin fat pad     Assessment:  Chronic Planter fasciitis right still very tender when I pressed that is worse when she gets up in the morning and after periods of sitting     Plan:  H&P reviewed and at this point I went ahead and I applied night splint with all instructions on usage and patient will start wearing the night splint at the current time and will sleep in it and use it for 20-minute periods with ice 2-3 times a day.  Reappoint 4 weeks to reevaluate

## 2022-09-03 NOTE — Transfer of Care (Signed)
Immediate Anesthesia Transfer of Care Note  Patient: Leah Edwards  Procedure(s) Performed: SINGLE COLUMN HEMORRHOIDECTOMY, POSSIBLE HEMORRHOIDOPEXY (Rectum)  Patient Location: PACU  Anesthesia Type:MAC  Level of Consciousness: drowsy, patient cooperative, and responds to stimulation  Airway & Oxygen Therapy: Patient Spontanous Breathing  Post-op Assessment: Report given to RN and Post -op Vital signs reviewed and stable  Post vital signs: Reviewed and stable  Last Vitals:  Vitals Value Taken Time  BP 102/73 09/03/22 0816  Temp    Pulse 78 09/03/22 0819  Resp 10 09/03/22 0819  SpO2 98 % 09/03/22 0819  Vitals shown include unvalidated device data.  Last Pain:  Vitals:   09/03/22 0559  TempSrc: Oral  PainSc: 0-No pain      Patients Stated Pain Goal: 5 (09/03/22 0559)  Complications: No notable events documented.

## 2022-09-03 NOTE — Anesthesia Procedure Notes (Signed)
Procedure Name: MAC Date/Time: 09/03/2022 7:32 AM  Performed by: Bishop Limbo, CRNAPre-anesthesia Checklist: Patient identified, Emergency Drugs available, Suction available, Patient being monitored and Timeout performed Patient Re-evaluated:Patient Re-evaluated prior to induction Oxygen Delivery Method: Simple face mask Placement Confirmation: positive ETCO2 Dental Injury: Teeth and Oropharynx as per pre-operative assessment

## 2022-09-03 NOTE — Discharge Instructions (Addendum)
ANORECTAL SURGERY: POST OP INSTRUCTIONS Take your usually prescribed home medications unless otherwise directed. DIET: During the first few hours after surgery sip on some liquids until you are able to urinate.  It is normal to not urinate for several hours after this surgery.  If you feel uncomfortable, please contact the office for instructions.  After you are able to urinate,you may eat, if you feel like it.  Follow a light bland diet the first 24 hours after arrival home, such as soup, liquids, crackers, etc.  Be sure to include lots of fluids daily (6-8 glasses).  Avoid fast food or heavy meals, as your are more likely to get nauseated.  Eat a low fat diet the next few days after surgery.  Limit caffeine intake to 1-2 servings a day. PAIN CONTROL: Pain is best controlled by a usual combination of several different methods TOGETHER: Muscle relaxation: Soak in a warm bath (or Sitz bath) three times a day and after bowel movements.  Continue to do this until all pain is resolved. Over the counter pain medication Prescription pain medication Most patients will experience some swelling and discomfort in the anus/rectal area and incisions.  Heat such as warm towels, sitz baths, warm baths, etc to help relax tight/sore spots and speed recovery.  Some people prefer to use ice, especially in the first couple days after surgery, as it may decrease the pain and swelling, or alternate between ice & heat.  Experiment to what works for you.  Swelling and bruising can take several weeks to resolve.  Pain can take even longer to completely resolve. It is helpful to take an over-the-counter pain medication regularly for the first few weeks.  Choose one of the following that works best for you: Naproxen (Aleve, etc)  Two 220mg tabs twice a day Ibuprofen (Advil, etc) Three 200mg tabs four times a day (every meal & bedtime) A  prescription for pain medication (such as percocet, oxycodone, hydrocodone, etc) should be  given to you upon discharge.  Take your pain medication as prescribed.  If you are having problems/concerns with the prescription medicine (does not control pain, nausea, vomiting, rash, itching, etc), please call us [phone] to see if we need to switch you to a different pain medicine that will work better for you and/or control your side effect better. If you need a refill on your pain medication, please contact your pharmacy.  They will contact our office to request authorization. Prescriptions will not be filled after 5 pm or on week-ends. KEEP YOUR BOWELS REGULAR and AVOID CONSTIPATION The goal is one to two soft bowel movements a day.  You should at least have a bowel movement every other day. Avoid getting constipated.  Between the surgery and the pain medications, it is common to experience some constipation. This can be very painful after rectal surgery.  Increasing fluid intake and taking a fiber supplement (such as Metamucil, Citrucel, FiberCon, etc) 1-2 times a day regularly will usually help prevent this problem from occurring.  A stool softener like colace is also recommended.  This can be purchased over the counter at your pharmacy.  You can take it up to 3 times a day.  If you do not have a bowel movement after 24 hrs since your surgery, take one does of milk of magnesia.  If you still haven't had a bowel movement 8-12 hours after that dose, take another dose.  If you don't have a bowel movement 48 hrs after surgery,   purchase a Fleets enema from the drug store and administer gently per package instructions.  If you still are having trouble with your bowel movements after that, please call the office for further instructions. If you develop diarrhea or have many loose bowel movements, simplify your diet to bland foods & liquids for a few days.  Stop any stool softeners and decrease your fiber supplement.  Switching to mild anti-diarrheal medications (Kayopectate, Pepto Bismol) can help.   If this worsens or does not improve, please call us.  Wound Care Remove your bandages before your first bowel movement or 8 hours after surgery.     Remove any wound packing material at this tim,e as well.  You do not need to repack the wound unless instructed otherwise.  Wear an absorbent pad or soft cotton gauze in your underwear to catch any drainage and help keep the area clean. You should change this every 2-3 hours while awake. Keep the area clean and dry.  Bathe / shower every day, especially after bowel movements.  Keep the area clean by showering / bathing over the incision / wound.   It is okay to soak an open wound to help wash it.  Wet wipes or showers / gentle washing after bowel movements is often less traumatic than regular toilet paper. You may have some styrofoam-like soft packing in the rectum which will come out with the first bowel movement.  You will often notice bleeding with bowel movements.  This should slow down by the end of the first week of surgery Expect some drainage.  This should slow down, too, by the end of the first week of surgery.  Wear an absorbent pad or soft cotton gauze in your underwear until the drainage stops. Do Not sit on a rubber or pillow ring.  This can make you symptoms worse.  You may sit on a soft pillow if needed.  ACTIVITIES as tolerated:   You may resume regular (light) daily activities beginning the next day--such as daily self-care, walking, climbing stairs--gradually increasing activities as tolerated.  If you can walk 30 minutes without difficulty, it is safe to try more intense activity such as jogging, treadmill, bicycling, low-impact aerobics, swimming, etc. Save the most intensive and strenuous activity for last such as sit-ups, heavy lifting, contact sports, etc  Refrain from any heavy lifting or straining until you are off narcotics for pain control.   You may drive when you are no longer taking prescription pain medication, you can  comfortably sit for long periods of time, and you can safely maneuver your car and apply brakes. You may have sexual intercourse when it is comfortable.  FOLLOW UP in our office Please call [facility] at [phone] to set up an appointment to see your surgeon in the office for a follow-up appointment approximately 3-4 weeks after your surgery. Make sure that you call for this appointment the day you arrive home to insure a convenient appointment time. 10. IF YOU HAVE DISABILITY OR FAMILY LEAVE FORMS, BRING THEM TO THE OFFICE FOR PROCESSING.  DO NOT GIVE THEM TO YOUR DOCTOR.     WHEN TO CALL us [phone]: Poor pain control Reactions / problems with new medications (rash/itching, nausea, etc)  Fever over 101.5 F (38.5 C) Inability to urinate Nausea and/or vomiting Worsening swelling or bruising Continued bleeding from incision. Increased pain, redness, or drainage from the incision  The clinic staff is available to answer your questions during regular business hours (8:30am-5pm).  Please don't hesitate to call and ask to speak to one of our nurses for clinical concerns.   A surgeon from [facility] is always on call at the hospitals   If you have a medical emergency, go to the nearest emergency room or call 911.    [facility] [address], [address], [place], [place]  [address] ? MAIN: [phone] ? TOLL FREE: [phone] ? FAX [phone] [email or web address]     No acetaminophen/Tylenol until after 12:00pm today if needed for pain.    Information for Discharge Teaching: EXPAREL (bupivacaine liposome injectable suspension)   Your surgeon or anesthesiologist gave you EXPAREL(bupivacaine) to help control your pain after surgery.  EXPAREL is a local anesthetic that provides pain relief by numbing the tissue around the surgical site. EXPAREL is designed to release pain medication over time and can control pain for up to 72  hours. Depending on how you respond to EXPAREL, you may require less pain medication during your recovery.  Possible side effects: Temporary loss of sensation or ability to move in the area where bupivacaine was injected. Nausea, vomiting, constipation Rarely, numbness and tingling in your mouth or lips, lightheadedness, or anxiety may occur. Call your doctor right away if you think you may be experiencing any of these sensations, or if you have other questions regarding possible side effects.  Follow all other discharge instructions given to you by your surgeon or nurse. Eat a healthy diet and drink plenty of water or other fluids.  If you return to the hospital for any reason within 96 hours following the administration of EXPAREL, it is important for health care providers to know that you have received this anesthetic. A teal colored band has been placed on your arm with the date, time and amount of EXPAREL you have received in order to alert and inform your health care providers. Please leave this armband in place for the full 96 hours following administration, and then you may remove the band.  Band can be removed on Monday September 07, 2022.    Post Anesthesia Home Care Instructions  Activity: Get plenty of rest for the remainder of the day. A responsible individual must stay with you for 24 hours following the procedure.  For the next 24 hours, DO NOT: -Drive a car -Advertising copywriter -Drink alcoholic beverages -Take any medication unless instructed by your physician -Make any legal decisions or sign important papers.  Meals: Start with liquid foods such as gelatin or soup. Progress to regular foods as tolerated. Avoid greasy, spicy, heavy foods. If nausea and/or vomiting occur, drink only clear liquids until the nausea and/or vomiting subsides. Call your physician if vomiting continues.  Special Instructions/Symptoms: Your throat may feel dry or sore from the anesthesia or the  breathing tube placed in your throat during surgery. If this causes discomfort, gargle with warm salt water. The discomfort should disappear within 24 hours.

## 2022-09-04 LAB — SURGICAL PATHOLOGY

## 2022-09-07 ENCOUNTER — Encounter (HOSPITAL_BASED_OUTPATIENT_CLINIC_OR_DEPARTMENT_OTHER): Payer: Self-pay | Admitting: General Surgery

## 2022-09-07 ENCOUNTER — Other Ambulatory Visit: Payer: Self-pay | Admitting: Podiatry

## 2022-09-07 DIAGNOSIS — M21619 Bunion of unspecified foot: Secondary | ICD-10-CM

## 2022-09-07 DIAGNOSIS — M79671 Pain in right foot: Secondary | ICD-10-CM

## 2022-09-07 DIAGNOSIS — M722 Plantar fascial fibromatosis: Secondary | ICD-10-CM

## 2022-09-07 NOTE — Anesthesia Postprocedure Evaluation (Signed)
Anesthesia Post Note  Patient: Leah Edwards  Procedure(s) Performed: SINGLE COLUMN HEMORRHOIDECTOMY (Rectum)     Patient location during evaluation: PACU Anesthesia Type: MAC Level of consciousness: awake and alert Pain management: pain level controlled Vital Signs Assessment: post-procedure vital signs reviewed and stable Respiratory status: spontaneous breathing, nonlabored ventilation, respiratory function stable and patient connected to nasal cannula oxygen Cardiovascular status: stable and blood pressure returned to baseline Postop Assessment: no apparent nausea or vomiting Anesthetic complications: no   No notable events documented.  Last Vitals:  Vitals:   09/03/22 0845 09/03/22 0923  BP: 121/72 (!) 157/77  Pulse: (!) 53 (!) 54  Resp: 14 16  Temp: (!) 36.4 C 36.4 C  SpO2: 99% 100%    Last Pain:  Vitals:   09/03/22 0923  TempSrc:   PainSc: 0-No pain                 Kennieth Rad

## 2022-09-10 DIAGNOSIS — Z79890 Hormone replacement therapy (postmenopausal): Secondary | ICD-10-CM | POA: Diagnosis not present

## 2022-09-10 DIAGNOSIS — N951 Menopausal and female climacteric states: Secondary | ICD-10-CM | POA: Diagnosis not present

## 2022-09-10 DIAGNOSIS — N393 Stress incontinence (female) (male): Secondary | ICD-10-CM | POA: Diagnosis not present

## 2022-09-10 DIAGNOSIS — J309 Allergic rhinitis, unspecified: Secondary | ICD-10-CM | POA: Diagnosis not present

## 2022-09-10 DIAGNOSIS — Z823 Family history of stroke: Secondary | ICD-10-CM | POA: Diagnosis not present

## 2022-09-10 DIAGNOSIS — Z880 Allergy status to penicillin: Secondary | ICD-10-CM | POA: Diagnosis not present

## 2022-09-10 DIAGNOSIS — K5900 Constipation, unspecified: Secondary | ICD-10-CM | POA: Diagnosis not present

## 2022-09-10 DIAGNOSIS — Z8249 Family history of ischemic heart disease and other diseases of the circulatory system: Secondary | ICD-10-CM | POA: Diagnosis not present

## 2022-09-30 ENCOUNTER — Ambulatory Visit: Payer: Medicare HMO | Admitting: Podiatry

## 2022-09-30 ENCOUNTER — Encounter: Payer: Self-pay | Admitting: Podiatry

## 2022-09-30 DIAGNOSIS — M722 Plantar fascial fibromatosis: Secondary | ICD-10-CM | POA: Diagnosis not present

## 2022-09-30 NOTE — Progress Notes (Signed)
Subjective:   Patient ID: Leah Edwards, female   DOB: 72 y.o.   MRN: [id number]   HPI Patient states feeling much better and states that wearing the splint has been beneficial   ROS      Objective:  Physical Exam  Neuro vas scaler status intact diminished discomfort plantar aspect right heel region     Assessment:  Significant improvement plantar fascial inflammation right with night splint and previous treatments     Plan:  H&P reviewed and at this point recommended continued splint usage but trying to use it just in the evening and during the day and not sleeping at.  Patient will be seen back as symptoms indicate

## 2022-11-17 DIAGNOSIS — Z86010 Personal history of colonic polyps: Secondary | ICD-10-CM | POA: Diagnosis not present

## 2022-11-17 DIAGNOSIS — K648 Other hemorrhoids: Secondary | ICD-10-CM | POA: Diagnosis not present

## 2022-11-17 DIAGNOSIS — Z09 Encounter for follow-up examination after completed treatment for conditions other than malignant neoplasm: Secondary | ICD-10-CM | POA: Diagnosis not present

## 2022-11-17 DIAGNOSIS — K5730 Diverticulosis of large intestine without perforation or abscess without bleeding: Secondary | ICD-10-CM | POA: Diagnosis not present

## 2022-11-17 DIAGNOSIS — K644 Residual hemorrhoidal skin tags: Secondary | ICD-10-CM | POA: Diagnosis not present

## 2022-11-18 ENCOUNTER — Ambulatory Visit: Payer: Medicare HMO | Admitting: Sports Medicine

## 2022-11-18 VITALS — BP 134/84 | Ht 65.5 in | Wt 140.0 lb

## 2022-11-18 DIAGNOSIS — M722 Plantar fascial fibromatosis: Secondary | ICD-10-CM | POA: Diagnosis not present

## 2022-11-18 NOTE — Assessment & Plan Note (Signed)
-   Patient's physical exam, hx and US imaging consistent w/ plantar fasciitis - She has failed previous steroid injection, night splinting, and orthotic therapy - Shock wave therapy performed today w/ some benefit - We will FU in 1 week for weekly shock wave treatment for 4 months and reassess for further need - Cont current orthotics. Can consider custom fitting at follow up.  - Discussed eccentric stretching exercises and ice therapy today.

## 2022-11-18 NOTE — Progress Notes (Signed)
Leah Edwards - 72 y.o. female MRN [id number]  Date of birth: 1951-04-25  PCP: Leah Montana, MD  Subjective:  No chief complaint on file.  Plantar fasciitis   HPI: Past Medical, Surgical, Social, and Family History Reviewed & Updated per EMR.   Patient is a 72 y.o. female here for acute on chronic plantar heal pain with a hx of previously treated plantar fasciitis. Her pain started in November and saw podiatry for steroid injection and wore night splints for 30 days as well as orthotics from [facility], all of which have provided minimal relief. She is an avid Armed forces operational officer and is able to play through her pain but has noticed she walks on her lateral foot now. Her past flares have responded to early steroid shots and casting but this time the pain has not responded.   She denies any recent trauma, fevers, chills, or falls.         Objective:  Physical Exam: VS: BP:134/84  HR: bpm  TEMP: ( )  RESP:   HT:5' 5.5" (166.4 cm)   WT:140 lb (63.5 kg)  BMI:22.93  Gen: NAD, comfortable in exam room Foot:  Echymosis: no Edema: no ROM: full bilat active and passive w/ tightness on dorsal flexion Gait: heel toe, limp favoring R foot when barefoot MT pain: no Callus pattern: medial aspect of big toe and lateral to TMT joint Lateral Mall: NT Medial Mall: NT Calcaneous: Tender to palpation over the insertion of the plantar fascia and pinpoint over the lateral and medial edges just above the plantar surface, negative squeeze test Achilles: mild TTP 2cm above calcaneal insertion, no nodules, edema or erythema Fat Pad: NT Post Tib: NT Ant Drawer: neg Other foot breakdown: none Long arch: preserved Transverse arch: preserved Sensation: intact  Limited MSK ultrasound of the right plantar fascia shows thickening (0.41 mm) as well as hypoechoic changes within the substance of the plantar fascia.  Findings are consistent with Planter fasciitis.   Assessment & Plan:   Plantar fasciitis,  right - Patient's physical exam, hx and US imaging consistent w/ plantar fasciitis - She has failed previous steroid injection, night splinting, and orthotic therapy - Shock wave therapy performed today w/ some benefit - We will FU in 1 week for weekly shock wave treatment for 4 months and reassess for further need - Cont current orthotics. Can consider custom fitting at follow up.  - Discussed eccentric stretching exercises and ice therapy today.   Rica Mote MD [facility] Sports Medicine Fellow    Patient seen and evaluated with the sports medicine fellow.  I agree with the above plan of care.  First soundwave treatment performed as below without difficulty.  We will plan on weekly visits for the next 4 weeks.  We also discussed custom orthotics but since she has spent $1600 recently on arch supports we will wait on that for now.  Procedure: ECSWT Indications: Right foot Planter fasciitis   Procedure Details Consent: Risks of procedure as well as the alternatives and risks of each were explained to the patient.  Written consent for procedure obtained. Time Out: Verified patient identification, verified procedure, site was marked, verified correct patient position, medications/allergies/relevent history reviewed.  The area was cleaned with alcohol swab.     The right plantar fascia was targeted for Extracorporeal shockwave therapy.    Preset: Planter fasciitis Power Level: 90 Frequency: 10 Impulse/cycles: 2500 Head size: Large   Patient tolerated procedure well without immediate complications

## 2022-11-19 DIAGNOSIS — Z01419 Encounter for gynecological examination (general) (routine) without abnormal findings: Secondary | ICD-10-CM | POA: Diagnosis not present

## 2022-11-19 DIAGNOSIS — Z780 Asymptomatic menopausal state: Secondary | ICD-10-CM | POA: Diagnosis not present

## 2022-11-19 DIAGNOSIS — Z79890 Hormone replacement therapy (postmenopausal): Secondary | ICD-10-CM | POA: Diagnosis not present

## 2022-11-19 DIAGNOSIS — Z1231 Encounter for screening mammogram for malignant neoplasm of breast: Secondary | ICD-10-CM | POA: Diagnosis not present

## 2022-11-27 ENCOUNTER — Ambulatory Visit: Payer: Medicare HMO | Admitting: Sports Medicine

## 2022-12-01 ENCOUNTER — Ambulatory Visit: Payer: Self-pay | Admitting: Sports Medicine

## 2022-12-15 ENCOUNTER — Ambulatory Visit (INDEPENDENT_AMBULATORY_CARE_PROVIDER_SITE_OTHER): Payer: Self-pay | Admitting: Sports Medicine

## 2022-12-15 DIAGNOSIS — M722 Plantar fascial fibromatosis: Secondary | ICD-10-CM

## 2022-12-15 NOTE — Assessment & Plan Note (Signed)
For ESWT # 2

## 2022-12-15 NOTE — Progress Notes (Signed)
ESWT # 2  Not much change with 1 treatment Has 6 months of trying RX for PF with limited resutls Inserts from [facility] and ? Of 0 drop shoes  Exam shows bilateral bunions and pronation with loss of long arch Mild TTP at medial heel insertion of PF  ESWT  Power 120 MJ Impulses 2500 Head size large Freq. 14  Patient tolerated procedure well.  Sterling Big, MD

## 2022-12-22 ENCOUNTER — Ambulatory Visit (INDEPENDENT_AMBULATORY_CARE_PROVIDER_SITE_OTHER): Payer: Self-pay | Admitting: Family Medicine

## 2022-12-22 ENCOUNTER — Ambulatory Visit: Payer: Self-pay | Admitting: Family Medicine

## 2022-12-22 DIAGNOSIS — M722 Plantar fascial fibromatosis: Secondary | ICD-10-CM

## 2022-12-22 NOTE — Progress Notes (Signed)
PCP: Laurann Montana, MD  Subjective:   HPI: Patient is a 72 y.o. female here for shockwave therapy.  Patient returns for third shockwave treatment of right plantar fasciitis. Still not much change.  Past Medical History:  Diagnosis Date   Anal fissure    Complication of anesthesia    Hemorrhoid    PONV (postoperative nausea and vomiting)     Current Outpatient Medications on File Prior to Visit  Medication Sig Dispense Refill   docusate sodium (COLACE) 100 MG capsule Take 1 capsule (100 mg total) by mouth 2 (two) times daily. (Patient taking differently: Take 100 mg by mouth daily.) 10 capsule 0   loratadine (CLARITIN) 10 MG tablet Take 10 mg by mouth at bedtime.     MINIVELLE 0.0375 MG/24HR      OVER THE COUNTER MEDICATION Vitamin d 3 5000 iu 3 caps daily     oxyCODONE (OXY IR/ROXICODONE) 5 MG immediate release tablet Take 1 tablet (5 mg total) by mouth every 4 (four) hours as needed for severe pain. 30 tablet 0   progesterone (PROMETRIUM) 100 MG capsule Take 100 mg by mouth at bedtime.     progesterone (PROMETRIUM) 200 MG capsule      UNABLE TO FIND Probiotic 24 billion cfu's daily     UNABLE TO FIND Estrogen patch  uses 1/2 patch 0.25 mg change 2 x weeks     No current facility-administered medications on file prior to visit.    Past Surgical History:  Procedure Laterality Date   bilateral breast reduction  2019   HEMORRHOID SURGERY N/A 09/03/2022   Procedure: SINGLE COLUMN HEMORRHOIDECTOMY;  Surgeon: Romie Levee, MD;  Location: [facility];  Service: General;  Laterality: N/A;   ovarian cyst     yrs ago   RHINOPLASTY     yrs ago    Allergies  Allergen Reactions   Amoxicillin Rash    There were no vitals taken for this visit.      No data to display              No data to display              Objective:  Physical Exam:  Gen: NAD, comfortable in exam room    Assessment & Plan:  1. Right plantar fasciitis - third shockwave  treatment performed today - with medium sized head.  Follow up in 1 week for fourth treatment. Procedure: ECSWT Indications:  right plantar fasciitis   Procedure Details Consent: Risks of procedure as well as the alternatives and risks of each were explained to the patient.  Written consent for procedure obtained. Time Out: Verified patient identification, verified procedure, site was marked, verified correct patient position, medications/allergies/relevent history reviewed.  The area was cleaned with alcohol swab.     The right plantar fascia was targeted for Extracorporeal shockwave therapy.    Preset: plantar fasciitis Power Level: 120 Frequency: 15 Impulse/cycles: 2500 Head size: medium   Patient tolerated procedure well without immediate complications

## 2022-12-29 ENCOUNTER — Ambulatory Visit (INDEPENDENT_AMBULATORY_CARE_PROVIDER_SITE_OTHER): Payer: Self-pay | Admitting: Sports Medicine

## 2022-12-29 DIAGNOSIS — M722 Plantar fascial fibromatosis: Secondary | ICD-10-CM

## 2022-12-29 NOTE — Progress Notes (Signed)
Patient ID: Leah Edwards, female   DOB: 05/10/1951, 72 y.o.   MRN: [id number]  Khrysta presents today for her fourth soundwave treatment for right foot Planter fasciitis.  Up until last week, she had not really noticed much benefit.  However, she does believe that she is now starting to notice some slight improvement.  She did play some limited tennis last week which did result in worsening heel pain.  She is asking about the possibility of custom orthotics.  Fourth soundwave treatment performed as below.  She tolerated this without difficulty.  She will follow-up next week for her fifth treatment.  She will also schedule a visit for custom orthotics as I believe the cushioning from these in addition to the arch support may be beneficial.  Procedure: ECSWT Indications: Right foot plantar fasciitis   Procedure Details Consent: Risks of procedure as well as the alternatives and risks of each were explained to the patient.  Written consent for procedure obtained. Time Out: Verified patient identification, verified procedure, site was marked, verified correct patient position, medications/allergies/relevent history reviewed.  The area was cleaned with alcohol swab.     The right plantar fascia was targeted for Extracorporeal shockwave therapy.    Preset: Plantar fasciitis Power Level: 120 Frequency: 15 Impulse/cycles: 2500 Head size: Medium   Patient tolerated procedure well without immediate complications

## 2023-01-02 DIAGNOSIS — R69 Illness, unspecified: Secondary | ICD-10-CM | POA: Diagnosis not present

## 2023-01-05 ENCOUNTER — Ambulatory Visit (INDEPENDENT_AMBULATORY_CARE_PROVIDER_SITE_OTHER): Payer: Self-pay | Admitting: Sports Medicine

## 2023-01-05 DIAGNOSIS — M722 Plantar fascial fibromatosis: Secondary | ICD-10-CM

## 2023-01-05 NOTE — Progress Notes (Signed)
Patient ID: Tonantzin Assante, female   DOB: 1950-07-06, 72 y.o.   MRN: [id number]  Paterica presents today for her fifth overall soundwave treatment for right foot plantar fasciitis.  Although she is not 100% improved, she does think that she is making progress.  Fifth treatment performed as below.  She tolerated it well.  She also has an appointment scheduled for tomorrow for new custom orthotics.  I will plan on seeing her again next week for her 6 soundwave treatment.  Procedure: ECSWT Indications: Right foot plantar fasciitis   Procedure Details Consent: Risks of procedure as well as the alternatives and risks of each were explained to the patient.  Written consent for procedure obtained. Time Out: Verified patient identification, verified procedure, site was marked, verified correct patient position, medications/allergies/relevent history reviewed.  The area was cleaned with alcohol swab.     The right plantar fascia was targeted for Extracorporeal shockwave therapy.    Preset: Plantar fasciitis Power Level: 120 Frequency: 16 Impulse/cycles: 2500 Head size: Medium   Patient tolerated procedure well without immediate complications   This note was dictated using Dragon naturally speaking software and may contain errors in syntax, spelling, or content which have not been identified prior to signing this note.

## 2023-01-06 ENCOUNTER — Ambulatory Visit: Payer: Medicare HMO | Admitting: Sports Medicine

## 2023-01-06 VITALS — BP 120/70 | Ht 65.0 in | Wt 140.0 lb

## 2023-01-06 DIAGNOSIS — M722 Plantar fascial fibromatosis: Secondary | ICD-10-CM | POA: Diagnosis not present

## 2023-01-06 NOTE — Progress Notes (Signed)
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 The patient was positioned in subtalar neutral position and 10 degrees of ankle dorsiflexion in a weight bearing stance. After completion of molding, a stable base was applied to the orthotic blank. The blank was ground to a stable position for weight bearing. Size: Red Medium EVA base size 8 Base: blue Med EVA Posting: first ray bilat Additional orthotic padding: none  Orthotics were placed in the patient's walking shoes and gave her good comfort and corrected the pronation noted on her gait  Patient was fitted for a : standard, cushioned, semi-rigid orthotic. # 2 The orthotic was heated and afterward the patient stood on the orthotic blank positioned on the orthotic stand. The patient was positioned in subtalar neutral position and 10 degrees of ankle dorsiflexion in a weight bearing stance. After completion of molding, a stable base was applied to the orthotic blank. The blank was ground to a stable position for weight bearing. Size: 8 medium EVA Base: Incorporated into the orthotic blank Posting: First ray post bilateral Additional orthotic padding: Foam heel cushion pads bilateral  Orthotics completely corrected the patient's gait.  She felt some  tightness in the heel but will try these to see if that resolves otherwise we should remove the foam cushion pads

## 2023-01-06 NOTE — Assessment & Plan Note (Signed)
Today the patient came to try custom orthotics She is making some progress with her exercises and stretches She would like to get back to more tennis and walking See the note of orthotic preparation for both walking and tennis shoes

## 2023-01-12 ENCOUNTER — Ambulatory Visit (INDEPENDENT_AMBULATORY_CARE_PROVIDER_SITE_OTHER): Payer: Self-pay | Admitting: Sports Medicine

## 2023-01-12 VITALS — Ht 65.0 in

## 2023-01-12 DIAGNOSIS — M722 Plantar fascial fibromatosis: Secondary | ICD-10-CM

## 2023-01-12 NOTE — Progress Notes (Signed)
Patient ID: Leah Edwards, female   DOB: [birth year], 72 y.o.   MRN: [id number]  Leah Edwards presents today for her 6 soundwave treatment for right foot Planter fasciitis.  She states that she has good days and bad days.  6 treatment was performed as below.  She tolerated it well.  She had new custom orthotics made last week and this is the last of her 6 scheduled treatments.  I have encouraged her to stay optimistic and that having good days and bad days are part of the natural process of Planter fasciitis.  I encouraged her to continue with her home exercises and she is planning on buying a new pair of tennis shoes soon.  She may continue with all activity as tolerated including tennis and will follow-up with me as needed.  Procedure: ECSWT Indications: Right plantar fasciitis   Procedure Details Consent: Risks of procedure as well as the alternatives and risks of each were explained to the patient.  Written consent for procedure obtained. Time Out: Verified patient identification, verified procedure, site was marked, verified correct patient position, medications/allergies/relevent history reviewed.  The area was cleaned with alcohol swab.     The right plantar fascia was targeted for Extracorporeal shockwave therapy.    Preset: Plantar fasciitis Power Level: 120 Frequency: 16 Impulse/cycles: 2500 Head size: Medium   Patient tolerated procedure well without immediate complications   This note was dictated using Dragon naturally speaking software and may contain errors in syntax, spelling, or content which have not been identified prior to signing this note.

## 2023-04-19 DIAGNOSIS — J309 Allergic rhinitis, unspecified: Secondary | ICD-10-CM | POA: Diagnosis not present

## 2023-04-19 DIAGNOSIS — K5909 Other constipation: Secondary | ICD-10-CM | POA: Diagnosis not present

## 2023-04-19 DIAGNOSIS — N393 Stress incontinence (female) (male): Secondary | ICD-10-CM | POA: Diagnosis not present

## 2023-04-19 DIAGNOSIS — R030 Elevated blood-pressure reading, without diagnosis of hypertension: Secondary | ICD-10-CM | POA: Diagnosis not present

## 2023-04-19 DIAGNOSIS — Z0000 Encounter for general adult medical examination without abnormal findings: Secondary | ICD-10-CM | POA: Diagnosis not present

## 2023-04-19 DIAGNOSIS — E559 Vitamin D deficiency, unspecified: Secondary | ICD-10-CM | POA: Diagnosis not present

## 2023-04-19 DIAGNOSIS — Z860100 Personal history of colon polyps, unspecified: Secondary | ICD-10-CM | POA: Diagnosis not present

## 2023-08-02 DIAGNOSIS — H35363 Drusen (degenerative) of macula, bilateral: Secondary | ICD-10-CM | POA: Diagnosis not present

## 2023-09-22 DIAGNOSIS — B3731 Acute candidiasis of vulva and vagina: Secondary | ICD-10-CM | POA: Diagnosis not present

## 2023-10-27 DIAGNOSIS — H43811 Vitreous degeneration, right eye: Secondary | ICD-10-CM | POA: Diagnosis not present

## 2024-01-12 DIAGNOSIS — Z1231 Encounter for screening mammogram for malignant neoplasm of breast: Secondary | ICD-10-CM | POA: Diagnosis not present

## 2024-04-24 DIAGNOSIS — E559 Vitamin D deficiency, unspecified: Secondary | ICD-10-CM | POA: Diagnosis not present

## 2024-04-24 DIAGNOSIS — K5909 Other constipation: Secondary | ICD-10-CM | POA: Diagnosis not present
